# Patient Record
Sex: Female | Born: 1975 | Race: Black or African American | Hispanic: No | Marital: Single | Smoking: Never smoker
Health system: Southern US, Community
[De-identification: ages and names within clinical notes are randomized; demographics above are authoritative.]

## PROBLEM LIST (undated history)

## (undated) DIAGNOSIS — E669 Obesity, unspecified: Secondary | ICD-10-CM

## (undated) DIAGNOSIS — M1990 Unspecified osteoarthritis, unspecified site: Secondary | ICD-10-CM

## (undated) DIAGNOSIS — J302 Other seasonal allergic rhinitis: Secondary | ICD-10-CM

## (undated) HISTORY — DX: Unspecified osteoarthritis, unspecified site: M19.90

---

## 1998-03-30 ENCOUNTER — Encounter: Admission: RE | Admit: 1998-03-30 | Discharge: 1998-03-30 | Payer: Self-pay | Admitting: Obstetrics & Gynecology

## 1998-04-14 ENCOUNTER — Ambulatory Visit (HOSPITAL_COMMUNITY): Admission: RE | Admit: 1998-04-14 | Discharge: 1998-04-14 | Payer: Self-pay | Admitting: Obstetrics & Gynecology

## 1998-04-19 ENCOUNTER — Encounter: Admission: RE | Admit: 1998-04-19 | Discharge: 1998-07-18 | Payer: Self-pay | Admitting: Obstetrics & Gynecology

## 1998-06-07 ENCOUNTER — Emergency Department (HOSPITAL_COMMUNITY): Admission: EM | Admit: 1998-06-07 | Discharge: 1998-06-07 | Payer: Self-pay | Admitting: Emergency Medicine

## 2001-03-31 ENCOUNTER — Emergency Department (HOSPITAL_COMMUNITY): Admission: EM | Admit: 2001-03-31 | Discharge: 2001-03-31 | Payer: Self-pay | Admitting: Emergency Medicine

## 2007-08-15 ENCOUNTER — Emergency Department (HOSPITAL_COMMUNITY): Admission: EM | Admit: 2007-08-15 | Discharge: 2007-08-15 | Payer: Self-pay | Admitting: Emergency Medicine

## 2011-10-21 ENCOUNTER — Emergency Department (INDEPENDENT_AMBULATORY_CARE_PROVIDER_SITE_OTHER)
Admission: EM | Admit: 2011-10-21 | Discharge: 2011-10-21 | Disposition: A | Discharge status: Home / Self Care | Payer: Worker's Compensation | Source: Home / Self Care | Attending: Family Medicine | Admitting: Family Medicine

## 2011-10-21 ENCOUNTER — Encounter (HOSPITAL_COMMUNITY): Payer: Self-pay

## 2011-10-21 DIAGNOSIS — S61012A Laceration without foreign body of left thumb without damage to nail, initial encounter: Secondary | ICD-10-CM

## 2011-10-21 DIAGNOSIS — Z23 Encounter for immunization: Secondary | ICD-10-CM

## 2011-10-21 DIAGNOSIS — S61209A Unspecified open wound of unspecified finger without damage to nail, initial encounter: Secondary | ICD-10-CM

## 2011-10-21 MED ORDER — TETANUS-DIPHTHERIA TOXOIDS TD 5-2 LFU IM INJ
INJECTION | INTRAMUSCULAR | Status: AC
  Filled 2011-10-21: qty 0.5

## 2011-10-21 MED ORDER — TETANUS-DIPHTHERIA TOXOIDS TD 5-2 LFU IM INJ
0.5000 mL | INJECTION | Freq: Once | INTRAMUSCULAR | Status: AC
  Administered 2011-10-21: 0.5 mL via INTRAMUSCULAR

## 2011-10-21 NOTE — ED Provider Notes (Signed)
History     CSN: [id number]  Arrival date & time 10/21/11  1606   First MD Initiated Contact with Patient 10/21/11 1607      Chief Complaint  Patient presents with  . Laceration    (Consider location/radiation/quality/duration/timing/severity/associated sxs/prior treatment) Patient is a 36 y.o. female presenting with skin laceration. The history is provided by the patient.  Laceration  The incident occurred 3 to 5 hours ago. The laceration is located on the left hand. The laceration is 1 cm in size. The laceration mechanism was a a clean knife. The patient is experiencing no pain. She reports no foreign bodies present. Her tetanus status is out of date.    History reviewed. No pertinent past medical history.  History reviewed. No pertinent past surgical history.  History reviewed. No pertinent family history.  History  Substance Use Topics  . Smoking status: Never Smoker   . Smokeless tobacco: Not on file  . Alcohol Use: No    OB History    Grav Para Term Preterm Abortions TAB SAB Ect Mult Living                  Review of Systems  Constitutional: Negative.   Skin: Positive for wound.    Allergies  Review of patient's allergies indicates no known allergies.  Home Medications  No current outpatient prescriptions on file.  BP 138/84  Pulse 86  Temp 97.9 F (36.6 C)  Resp 18  SpO2 100%  Physical Exam  Nursing note and vitals reviewed. Constitutional: She is oriented to person, place, and time. She appears well-developed and well-nourished.  HENT:  Head: Normocephalic.  Neurological: She is alert and oriented to person, place, and time.  Skin:       Lac as noted.    ED Course  LACERATION REPAIR Date/Time: 10/21/2011 5:44 PM Performed by: Linna Hoff Authorized by: Bradd Canary D Consent: Verbal consent obtained. Consent given by: patient Body area: upper extremity Location details: left thumb Laceration length: 1 cm Tendon involvement:  none Nerve involvement: none Vascular damage: no Patient sedated: no Preparation: Patient was prepped and draped in the usual sterile fashion. Debridement: none Degree of undermining: none Skin closure: glue Approximation: close Approximation difficulty: simple Patient tolerance: Patient tolerated the procedure well with no immediate complications.   (including critical care time)  Labs Reviewed - No data to display No results found.   1. Laceration of thumb, left       MDM          Linna Hoff, MD 10/21/11 1750

## 2011-10-21 NOTE — ED Notes (Signed)
Pt was cutting ham at work and cut lt thumb.

## 2012-03-22 ENCOUNTER — Encounter (HOSPITAL_COMMUNITY): Payer: Self-pay | Admitting: Emergency Medicine

## 2012-03-22 ENCOUNTER — Emergency Department (HOSPITAL_COMMUNITY)
Admission: EM | Admit: 2012-03-22 | Discharge: 2012-03-22 | Disposition: A | Discharge status: Home / Self Care | Payer: Self-pay | Attending: Emergency Medicine | Admitting: Emergency Medicine

## 2012-03-22 DIAGNOSIS — M545 Low back pain, unspecified: Secondary | ICD-10-CM

## 2012-03-22 HISTORY — DX: Obesity, unspecified: E66.9

## 2012-03-22 MED ORDER — IBUPROFEN 800 MG PO TABS: 800.0000 mg | ORAL_TABLET | Freq: Three times a day (TID) | ORAL | Status: AC

## 2012-03-22 MED ORDER — DIAZEPAM 5 MG PO TABS: 5.0000 mg | ORAL_TABLET | Freq: Two times a day (BID) | ORAL | Status: AC

## 2012-03-22 NOTE — ED Notes (Signed)
PT. REPORTS LOW BACK PAIN FOR 2 DAYS , DENIES INJURY , AMBULATORY , NO DYSURIA OR HEMATURIA.

## 2012-03-25 NOTE — ED Provider Notes (Signed)
History     CSN: [id number]  Arrival date & time 03/22/12  0009   First MD Initiated Contact with Patient 03/22/12 0045      Chief Complaint  Patient presents with  . Back Pain    (Consider location/radiation/quality/duration/timing/severity/associated sxs/prior treatment) Patient is a 36 y.o. female presenting with back pain. The history is provided by the patient.  Back Pain  This is a new problem. The current episode started 2 days ago. The problem occurs constantly. The problem has not changed since onset.The pain is associated with no known injury. The pain is present in the lumbar spine. The quality of the pain is described as aching. The pain does not radiate. The pain is mild. The symptoms are aggravated by bending, twisting and certain positions. The pain is the same all the time. Pertinent negatives include no fever, no numbness, no headaches, no abdominal pain, no bowel incontinence, no perianal numbness, no bladder incontinence, no dysuria, no pelvic pain, no leg pain, no paresthesias, no paresis, no tingling and no weakness. She has tried nothing for the symptoms.    Past Medical History  Diagnosis Date  . Obesity     History reviewed. No pertinent past surgical history.  No family history on file.  History  Substance Use Topics  . Smoking status: Never Smoker   . Smokeless tobacco: Not on file  . Alcohol Use: No    OB History    Grav Para Term Preterm Abortions TAB SAB Ect Mult Living                  Review of Systems  Constitutional: Negative for fever and chills.  Gastrointestinal: Negative for nausea, vomiting, abdominal pain and bowel incontinence.  Genitourinary: Negative for bladder incontinence, dysuria and pelvic pain.  Musculoskeletal: Positive for back pain.  Skin: Negative for wound.  Neurological: Negative for tingling, weakness, numbness, headaches and paresthesias.    Allergies  Review of patient's allergies indicates no known  allergies.  Home Medications   Current Outpatient Rx  Name Route Sig Dispense Refill  . DIAZEPAM 5 MG PO TABS Oral Take 1 tablet (5 mg total) by mouth 2 (two) times daily. 10 tablet 0  . IBUPROFEN 800 MG PO TABS Oral Take 1 tablet (800 mg total) by mouth 3 (three) times daily. 21 tablet 0    BP 137/86  Pulse 80  Temp 97.7 F (36.5 C) (Oral)  Resp 18  SpO2 100%  Physical Exam  Nursing note and vitals reviewed. Constitutional: She is oriented to person, place, and time. She appears well-developed and well-nourished. No distress.  HENT:  Head: Normocephalic and atraumatic.  Neck: Normal range of motion.  Cardiovascular: Normal rate, regular rhythm and normal heart sounds.   Pulmonary/Chest: Effort normal and breath sounds normal. She exhibits no tenderness.  Abdominal: Soft. Bowel sounds are normal. There is no tenderness.  Musculoskeletal:       Lumbar back: She exhibits decreased range of motion and tenderness. She exhibits no bony tenderness, no swelling and no edema.       Spine: no palp stepoff, crepitus, or deformity. Palpable paravertebral spasm to R low back. Neg straight leg raise.  Neurological: She is alert and oriented to person, place, and time. She has normal strength. No sensory deficit. Gait normal.  Reflex Scores:      Patellar reflexes are 2+ on the right side and 2+ on the left side.      Achilles reflexes are 2+ on  the right side and 2+ on the left side.      Normal, steady gait Sensation grossly intact to lt touch  Skin: Skin is warm and dry. She is not diaphoretic.    ED Course  Procedures (including critical care time)  Labs Reviewed - No data to display No results found.   1. Low back pain       MDM  Pt presents with low back pain. Palp spasm on exam. No "red flags" for back pain by hx or exam. Rxes for ibuprofen, valium. Reasons to return discussed.        Grant Fontana, PA-C 03/25/12 2240

## 2012-04-30 NOTE — ED Provider Notes (Signed)
Medical screening examination/treatment/procedure(s) were performed by non-physician practitioner and as supervising physician I was immediately available for consultation/collaboration.  Tiffanee Mcnee K Gavinn Collard, MD 04/30/12 0416 

## 2015-11-20 ENCOUNTER — Ambulatory Visit (HOSPITAL_COMMUNITY)
Admission: EM | Admit: 2015-11-20 | Discharge: 2015-11-20 | Disposition: A | Discharge status: Home / Self Care | Payer: Self-pay | Attending: Emergency Medicine | Admitting: Emergency Medicine

## 2015-11-20 ENCOUNTER — Encounter (HOSPITAL_COMMUNITY): Payer: Self-pay | Admitting: *Deleted

## 2015-11-20 DIAGNOSIS — L03012 Cellulitis of left finger: Secondary | ICD-10-CM

## 2015-11-20 HISTORY — DX: Other seasonal allergic rhinitis: J30.2

## 2015-11-20 MED ORDER — CEPHALEXIN 500 MG PO CAPS: 500.0000 mg | ORAL_CAPSULE | Freq: Four times a day (QID) | ORAL | Status: DC

## 2015-11-20 NOTE — Discharge Instructions (Signed)
You have an infection around the nail. This is called paronychia. Take Keflex 4 times a day for 1 week. Soak the finger in warm soapy water 3 times a day for 20 minutes. Dry the finger well after soaking it. Please get some rubber gloves to wear when washing dishes. Prolonged submersion in water will make this worse. You should see improvement over the next several days. The nail will take some time to grow back normally. Follow-up as needed.

## 2015-11-20 NOTE — ED Notes (Signed)
Pt describes paronychia to left 4th finger x 1 wk; initially had been applying warm compresses and popped with needle - had purulent drainage with some improvement, but now swelling increasing again.

## 2015-11-20 NOTE — ED Provider Notes (Signed)
CSN: [id number]     Arrival date & time 11/20/15  1429 History   First MD Initiated Contact with Patient 11/20/15 1625     Chief Complaint  Patient presents with  . Hand Pain   (Consider location/radiation/quality/duration/timing/severity/associated sxs/prior Treatment) HPI  She is a 40 year old woman here for evaluation of left ring finger pain. She states 2 weeks ago she noticed pain and swelling around the cuticle. A week ago she popped it with a needle and expressed some pus. Since then, the pain has been better, but she continues to have some swelling. No fevers. She denies biting her nails. She does work washing dishes.  Past Medical History  Diagnosis Date  . Obesity   . Seasonal allergies    History reviewed. No pertinent past surgical history. No family history on file. Social History  Substance Use Topics  . Smoking status: Never Smoker   . Smokeless tobacco: None  . Alcohol Use: No   OB History    No data available     Review of Systems As in history of present illness Allergies  Review of patient's allergies indicates no known allergies.  Home Medications   Prior to Admission medications   Medication Sig Start Date End Date Taking? Authorizing Provider  CETIRIZINE HCL PO Take by mouth.   Yes Historical Provider, MD  UNKNOWN TO PATIENT BCPs   Yes Historical Provider, MD  cephALEXin (KEFLEX) 500 MG capsule Take 1 capsule (500 mg total) by mouth 4 (four) times daily. 11/20/15   Charm RingsErin J Honig, MD   Meds Ordered and Administered this Visit  Medications - No data to display  BP 123/80 mmHg  Pulse 66  Temp(Src) 97.8 F (36.6 C) (Oral)  SpO2 100%  LMP 10/19/2015 (Approximate) No data found.   Physical Exam  Constitutional: She is oriented to person, place, and time. She appears well-developed and well-nourished. No distress.  Cardiovascular: Normal rate.   Pulmonary/Chest: Effort normal.  Musculoskeletal:  Left hand: Mild swelling at the fourth finger  cuticle. No tenderness or redness. No fluctuance. The ring finger nail is rough at the proximal edge.  Neurological: She is alert and oriented to person, place, and time.    ED Course  Procedures (including critical care time)  Labs Review Labs Reviewed - No data to display  Imaging Review No results found.   MDM   1. Paronychia of finger of left hand    We'll treat with Keflex. Nothing amenable to drainage today. Recommended warm soaks for the next several days. Discussed importance of keeping her hands as dry as possible during the day to prevent this from becoming a chronic issue. Follow-up as needed.    Charm RingsErin J Honig, MD 11/20/15 [phone]

## 2020-11-24 ENCOUNTER — Other Ambulatory Visit: Payer: Self-pay | Admitting: Obstetrics and Gynecology

## 2020-11-24 DIAGNOSIS — Z1231 Encounter for screening mammogram for malignant neoplasm of breast: Secondary | ICD-10-CM

## 2020-12-16 ENCOUNTER — Ambulatory Visit: Payer: Self-pay

## 2020-12-23 ENCOUNTER — Ambulatory Visit: Payer: No Typology Code available for payment source | Admitting: *Deleted

## 2020-12-23 ENCOUNTER — Other Ambulatory Visit: Payer: Self-pay

## 2020-12-23 ENCOUNTER — Ambulatory Visit
Admission: RE | Admit: 2020-12-23 | Discharge: 2020-12-23 | Disposition: A | Discharge status: Home / Self Care | Payer: Self-pay | Source: Ambulatory Visit | Attending: Obstetrics and Gynecology | Admitting: Obstetrics and Gynecology

## 2020-12-23 VITALS — BP 128/74 | Wt 339.4 lb

## 2020-12-23 DIAGNOSIS — Z1231 Encounter for screening mammogram for malignant neoplasm of breast: Secondary | ICD-10-CM

## 2020-12-23 DIAGNOSIS — Z1239 Encounter for other screening for malignant neoplasm of breast: Secondary | ICD-10-CM

## 2020-12-23 NOTE — Patient Instructions (Signed)
Explained breast self awareness with Kaitlyn Mueller. Patient did not need a Pap smear today due to last Pap smear was in February or March 2022 per patient. Let her know [facility] will cover Pap smears every 3 years unless has a history of abnormal Pap smears. Referred patient to the [facility] for a screening mammogram on the mobile unit. Appointment scheduled Thursday, Dec 23, 2020 at 0940. Patient escorted to the mobile unit following [facility] appointment for her screening mammogram. Let patient know the Breast Center will follow up with her within the next couple weeks with results of her mammogram by letter or phone. Cassandre Oleksy verbalized understanding.  Aadvika Konen, Kathaleen Maser, RN 9:08 AM

## 2020-12-23 NOTE — Progress Notes (Signed)
Ms. Kaitlyn Mueller is a 45 y.o. female who presents to [facility] clinic today with no complaints.    Pap Smear: Pap smear not completed today. Last Pap smear was in February or March 2021 at the [facility] clinic and was normal per patient. Per patient has no history of an abnormal Pap smear. Last Pap smear result is not available in Epic.   Physical exam: Breasts Right breast larger than left breast that per patient is normal for her. No skin abnormalities bilateral breasts. No nipple retraction bilateral breasts. No nipple discharge bilateral breasts. No lymphadenopathy. No lumps palpated bilateral breasts. No complaints of pain or tenderness on exam.      Pelvic/Bimanual Pap is not indicated today per [facility] guidelines.   Smoking History: Patient has never smoked.   Patient Navigation: Patient education provided. Access to services provided for patient through [facility] program.    Breast and Cervical Cancer Risk Assessment: Patient does not have family history of breast cancer, known genetic mutations, or radiation treatment to the chest before age 62. Patient does not have history of cervical dysplasia, immunocompromised, or DES exposure in-utero.  Risk Assessment    Risk Scores      12/23/2020   Last edited by: Kaitlyn Mueller, CMA   5-year risk: 0.6 %   Lifetime risk: 7.3 %         A: [facility] exam without pap smear No complaints.  P: Referred patient to the [facility] for a screening mammogram on the mobile unit. Appointment scheduled Thursday, Dec 23, 2020 at 0940.  Kaitlyn Heidelberg, RN 12/23/2020 9:08 AM

## 2021-08-06 ENCOUNTER — Other Ambulatory Visit: Payer: Self-pay

## 2021-08-06 ENCOUNTER — Ambulatory Visit (HOSPITAL_COMMUNITY)
Admission: EM | Admit: 2021-08-06 | Discharge: 2021-08-06 | Disposition: A | Discharge status: Home / Self Care | Payer: No Typology Code available for payment source | Attending: Physician Assistant | Admitting: Physician Assistant

## 2021-08-06 ENCOUNTER — Encounter (HOSPITAL_COMMUNITY): Payer: Self-pay | Admitting: *Deleted

## 2021-08-06 DIAGNOSIS — M5441 Lumbago with sciatica, right side: Secondary | ICD-10-CM

## 2021-08-06 DIAGNOSIS — M25551 Pain in right hip: Secondary | ICD-10-CM

## 2021-08-06 MED ORDER — TIZANIDINE HCL 4 MG PO TABS: 4.0000 mg | ORAL_TABLET | Freq: Three times a day (TID) | ORAL | 0 refills | Status: DC | PRN

## 2021-08-06 MED ORDER — DICLOFENAC SODIUM 75 MG PO TBEC: 75.0000 mg | DELAYED_RELEASE_TABLET | Freq: Two times a day (BID) | ORAL | 0 refills | Status: DC

## 2021-08-06 NOTE — ED Provider Notes (Signed)
[facility]    CSN: [id number] Arrival date & time: 08/06/21  1138      History   Chief Complaint Chief Complaint  Patient presents with   Hip Pain   Back Pain    HPI Kaitlyn Mueller is a 45 y.o. female.   Patient presents today with a several day history of right-sided lower back pain as well as several week history of right lateral hip pain.  She denies any known injury or increase in activity prior to symptom onset.  Does report that back pain began after a hard sneeze and has been worsening since that time.  Pain is rated 8 on a 0-10 pain scale, localized to lower back with radiation into right hip, described as sharp, worse with certain movements or changing position, no alleviating factors identified.  She has tried Bengay/IcyHot as well as Motrin without improvement of symptoms.  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  She has had occasional paresthesias in her right leg but denies any bowel/bladder incontinence, bowel/bladder retention, saddle anesthesia, lower extremity weakness.  Denies previous injury or surgery involving back or hip.  She denies any recent falls or car accidents.  Denies history of malignancy.  She is confident she is not pregnant.   Past Medical History:  Diagnosis Date   Obesity    Seasonal allergies     There are no problems to display for this patient.   History reviewed. No pertinent surgical history.  OB History   No obstetric history on file.      Home Medications    Prior to Admission medications   Medication Sig Start Date End Date Taking? Authorizing Provider  diclofenac (VOLTAREN) 75 MG EC tablet Take 1 tablet (75 mg total) by mouth 2 (two) times daily. 08/06/21  Yes Vanessa Alesi K, PA-C  tiZANidine (ZANAFLEX) 4 MG tablet Take 1 tablet (4 mg total) by mouth every 8 (eight) hours as needed for muscle spasms.  08/06/21  Yes Anamaria Dusenbury K, PA-C  cephALEXin (KEFLEX) 500 MG capsule Take 1 capsule (500 mg total) by mouth 4 (four) times daily. Patient not taking: Reported on 12/23/2020 11/20/15   Charm Rings, MD  CETIRIZINE HCL PO Take by mouth. Patient not taking: Reported on 12/23/2020    [provider]  UNKNOWN TO PATIENT BCPs Patient not taking: Reported on 12/23/2020    [provider]    Family History Family History  Problem Relation Age of Onset   Hypertension Mother    Breast cancer Neg Hx     Social History Social History   Tobacco Use   Smoking status: Never   Smokeless tobacco: Never  Vaping Use   Vaping Use: Never used  Substance Use Topics   Alcohol use: No   Drug use: No     Allergies   Patient has no known allergies.   Review of Systems Review of Systems  Constitutional:  Positive for activity change. Negative for appetite change, fatigue and fever.  Respiratory:  Negative for cough and shortness of breath.   Cardiovascular:  Negative for chest pain.  Gastrointestinal:  Negative for abdominal pain, diarrhea, nausea and vomiting.  Genitourinary:  Negative for dysuria, frequency, pelvic pain, urgency, vaginal bleeding, vaginal discharge and vaginal pain.  Musculoskeletal:  Positive for arthralgias, back pain and gait problem. Negative for joint swelling and  myalgias.  Neurological:  Positive for numbness. Negative for dizziness, weakness, light-headedness and headaches.    Physical Exam Triage Vital Signs ED Triage Vitals  Enc Vitals Group     BP 08/06/21 1257 (!) 144/85     Pulse Rate 08/06/21 1257 72     Resp 08/06/21 1257 18     Temp 08/06/21 1257 97.9 F (36.6 C)     Temp src --      SpO2 08/06/21 1257 100 %     Weight --      Height --      Head Circumference --      Peak Flow --      Pain Score 08/06/21 1254 8     Pain Loc --      Pain Edu? --      Excl. in GC? --    No data found.  Updated Vital Signs BP (!) 144/85     Pulse 72    Temp 97.9 F (36.6 C)    Resp 18    LMP 05/21/2021    SpO2 100%   Visual Acuity Right Eye Distance:   Left Eye Distance:   Bilateral Distance:    Right Eye Near:   Left Eye Near:    Bilateral Near:     Physical Exam Vitals reviewed.  Constitutional:      General: She is awake. She is not in acute distress.    Appearance: Normal appearance. She is well-developed. She is not ill-appearing.     Comments: Very pleasant female appears stated age in no acute distress sitting comfortably in exam room  HENT:     Head: Normocephalic and atraumatic.  Cardiovascular:     Rate and Rhythm: Normal rate and regular rhythm.     Heart sounds: Normal heart sounds, S1 normal and S2 normal. No murmur heard. Pulmonary:     Effort: Pulmonary effort is normal.     Breath sounds: Normal breath sounds. No wheezing, rhonchi or rales.     Comments: Clear to auscultation bilaterally Abdominal:     Palpations: Abdomen is soft.     Tenderness: There is no abdominal tenderness. There is no right CVA tenderness, left CVA tenderness, guarding or rebound.  Musculoskeletal:     Cervical back: No tenderness or bony tenderness.     Thoracic back: No tenderness or bony tenderness.     Lumbar back: Tenderness present. No bony tenderness.     Right hip: Tenderness present. No deformity or bony tenderness. Normal range of motion. Normal strength.     Comments: Back: No pain percussion of vertebrae.  Tenderness palpation of right lumbar paraspinal muscles.  No deformity or spasm noted.  Strength 5/5 bilateral lower extremities.  Psychiatric:        Behavior: Behavior is cooperative.     UC Treatments / Results  Labs (all labs ordered are listed, but only abnormal results are displayed) Labs Reviewed - No data to display  EKG   Radiology No results found.  Procedures Procedures (including critical care time)  Medications Ordered in UC Medications - No data to display  Initial Impression /  Assessment and Plan / UC Course  I have reviewed the triage vital signs and the nursing notes.  Pertinent labs & imaging results that were available during my care of the patient were reviewed by me and considered in my medical decision making (see chart for details).     No indication for plain films given patient denies  any recent trauma and has no bony tenderness.  Patient was prescribed diclofenac 75 mg twice daily for 10 days with instruction not to take additional NSAIDs with this medication due to risk of GI bleeding.  She can use Tylenol for breakthrough pain.  Encouraged topical medication as well as heat and gentle stretch for symptom relief.  She was prescribed Zanaflex to be used up to 3 times a day with instruction not to drive or drink alcohol while taking this medication due to associated sedation.  Discussed that if symptoms or not improving she should follow-up with sports medicine provider for potential imaging and additional interventions.  Discussed alarm symptoms that warrant emergent evaluation including lower extremity weakness, saddle anesthesia, bowel/bladder incontinence or retention, increased pain.  Strict return precautions given to which she expressed understanding.  Work excuse note provided as requested.  Final Clinical Impressions(s) / UC Diagnoses   Final diagnoses:  Acute right-sided low back pain with right-sided sciatica  Right hip pain     Discharge Instructions      Start diclofenac twice daily.  This is like ibuprofen and you should not take additional NSAIDs including aspirin, ibuprofen/Advil, naproxen/Aleve with this medication as can cause stomach bleeding.  Take your tizanidine (Zanaflex) up to 3 times a day.  This will make you sleepy so do not drive or drink alcohol while taking it.  Use heat, rest, stretch for additional symptom relief.  If symptoms not improving please follow-up with sports medicine as we discussed.  If you have any worsening  symptoms including severe pain, difficulty moving your lower leg, going to the bathroom on yourself or trouble going to the bathroom, numbness of the inner part of your leg you need to go to the emergency room.     ED Prescriptions     Medication Sig Dispense Auth. Provider   tiZANidine (ZANAFLEX) 4 MG tablet Take 1 tablet (4 mg total) by mouth every 8 (eight) hours as needed for muscle spasms. 30 tablet Jandi Swiger K, PA-C   diclofenac (VOLTAREN) 75 MG EC tablet Take 1 tablet (75 mg total) by mouth 2 (two) times daily. 20 tablet Paolina Karwowski, Noberto Retort, PA-C      PDMP not reviewed this encounter.   Jeani Hawking, PA-C 08/06/21 1326

## 2021-08-06 NOTE — ED Triage Notes (Signed)
Pt reports her back started to hurt after she sneezed  the other day. Pt also reports Bil hip pain for over a week.

## 2021-08-06 NOTE — Discharge Instructions (Signed)
Start diclofenac twice daily.  This is like ibuprofen and you should not take additional NSAIDs including aspirin, ibuprofen/Advil, naproxen/Aleve with this medication as can cause stomach bleeding.  Take your tizanidine (Zanaflex) up to 3 times a day.  This will make you sleepy so do not drive or drink alcohol while taking it.  Use heat, rest, stretch for additional symptom relief.  If symptoms not improving please follow-up with sports medicine as we discussed.  If you have any worsening symptoms including severe pain, difficulty moving your lower leg, going to the bathroom on yourself or trouble going to the bathroom, numbness of the inner part of your leg you need to go to the emergency room.

## 2022-01-23 ENCOUNTER — Other Ambulatory Visit: Payer: Self-pay | Admitting: Obstetrics and Gynecology

## 2022-01-23 DIAGNOSIS — Z1231 Encounter for screening mammogram for malignant neoplasm of breast: Secondary | ICD-10-CM

## 2022-02-03 ENCOUNTER — Ambulatory Visit
Admission: RE | Admit: 2022-02-03 | Discharge: 2022-02-03 | Disposition: A | Discharge status: Home / Self Care | Payer: No Typology Code available for payment source | Source: Ambulatory Visit | Attending: Obstetrics and Gynecology | Admitting: Obstetrics and Gynecology

## 2022-02-03 DIAGNOSIS — Z1231 Encounter for screening mammogram for malignant neoplasm of breast: Secondary | ICD-10-CM

## 2022-10-19 IMAGING — MG MM DIGITAL SCREENING BILAT W/ TOMO AND CAD
6 of 12 series · 6 of 36 positions shown · non-contrast
Comparison: None.

CLINICAL DATA: Screening.

EXAM:
DIGITAL SCREENING BILATERAL MAMMOGRAM WITH TOMOSYNTHESIS AND CAD
TECHNIQUE: Bilateral screening digital craniocaudal and mediolateral oblique
mammograms were obtained. Bilateral screening digital breast
tomosynthesis was performed. The images were evaluated with
computer-aided detection.

[L CC synth-2D (1 of 2)]
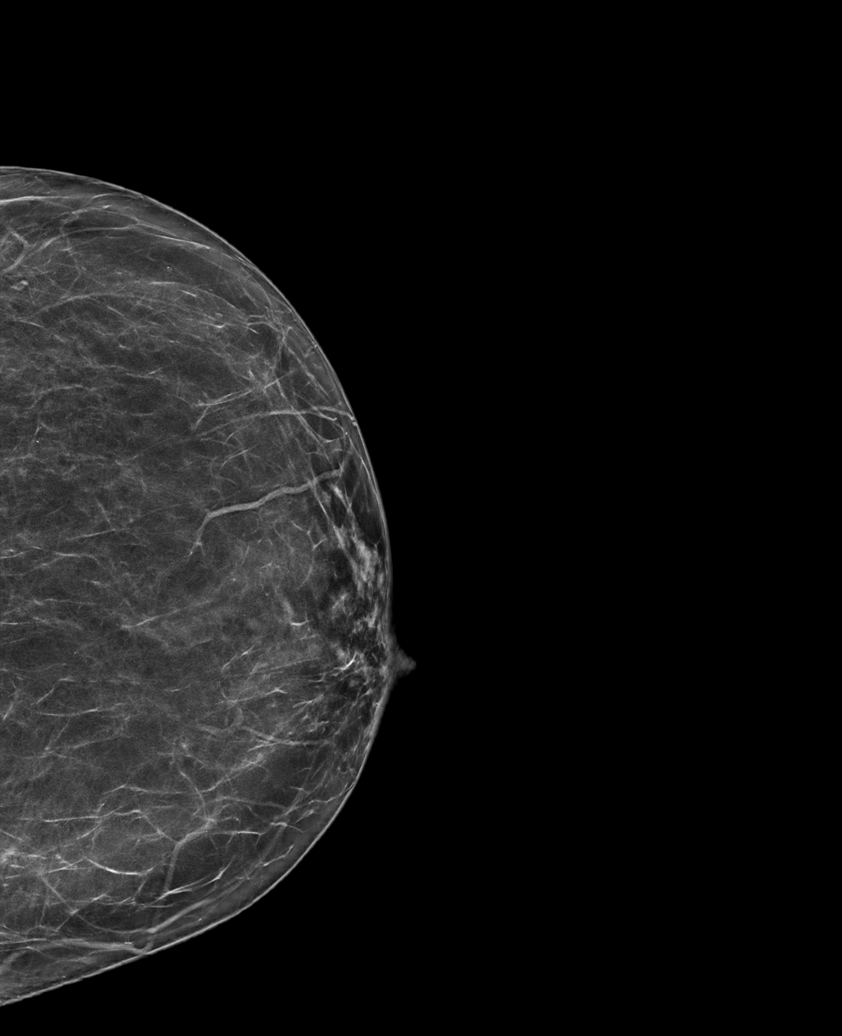

[R MLO synth-2D]
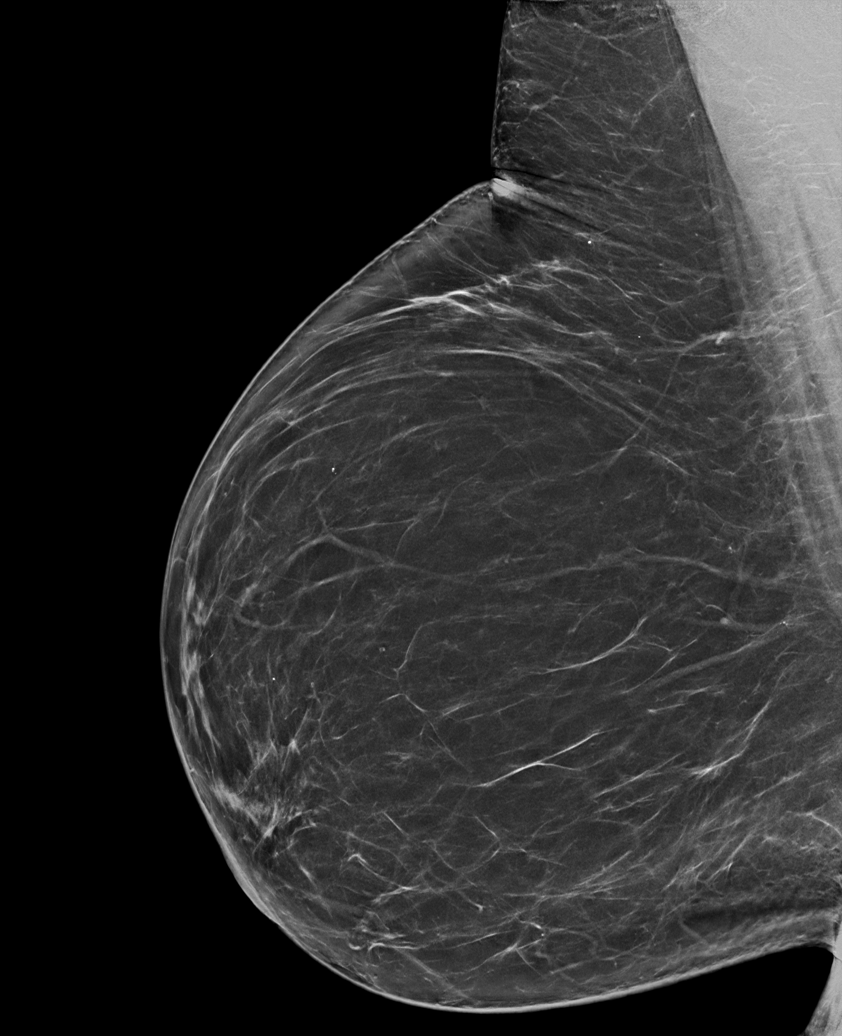

[L CC synth-2D (2 of 2)]
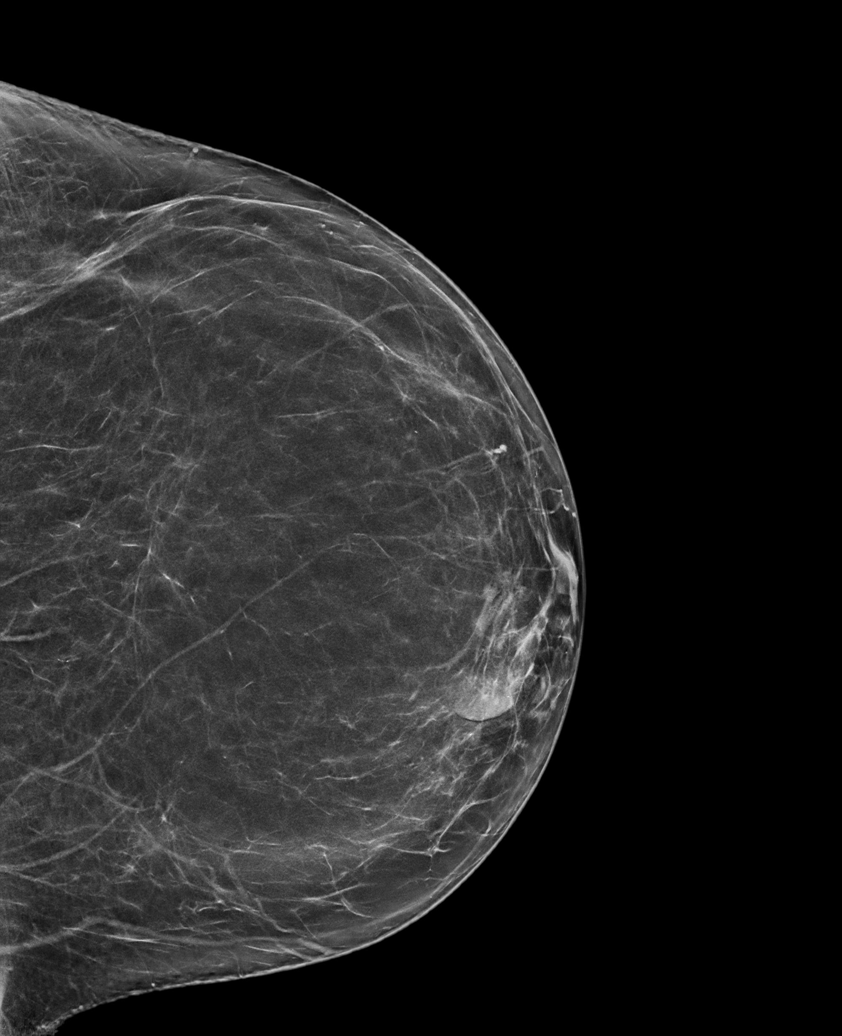

[L MLO synth-2D]
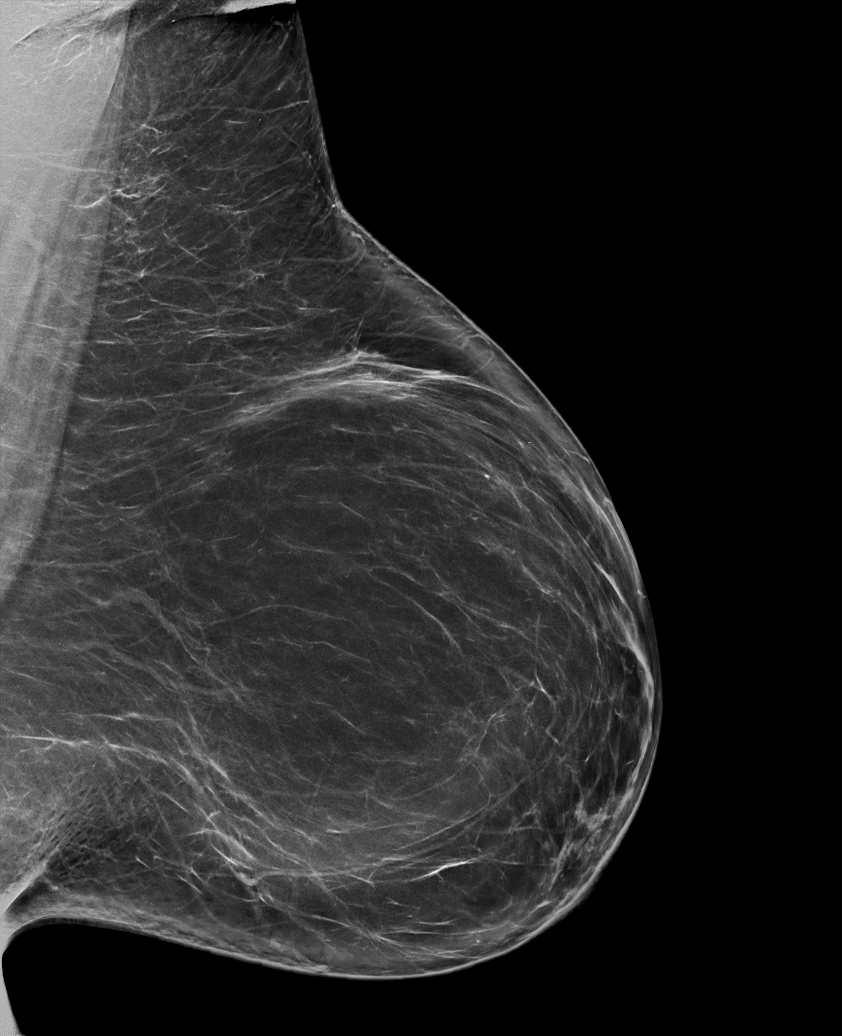

[R CC synth-2D (1 of 2)]
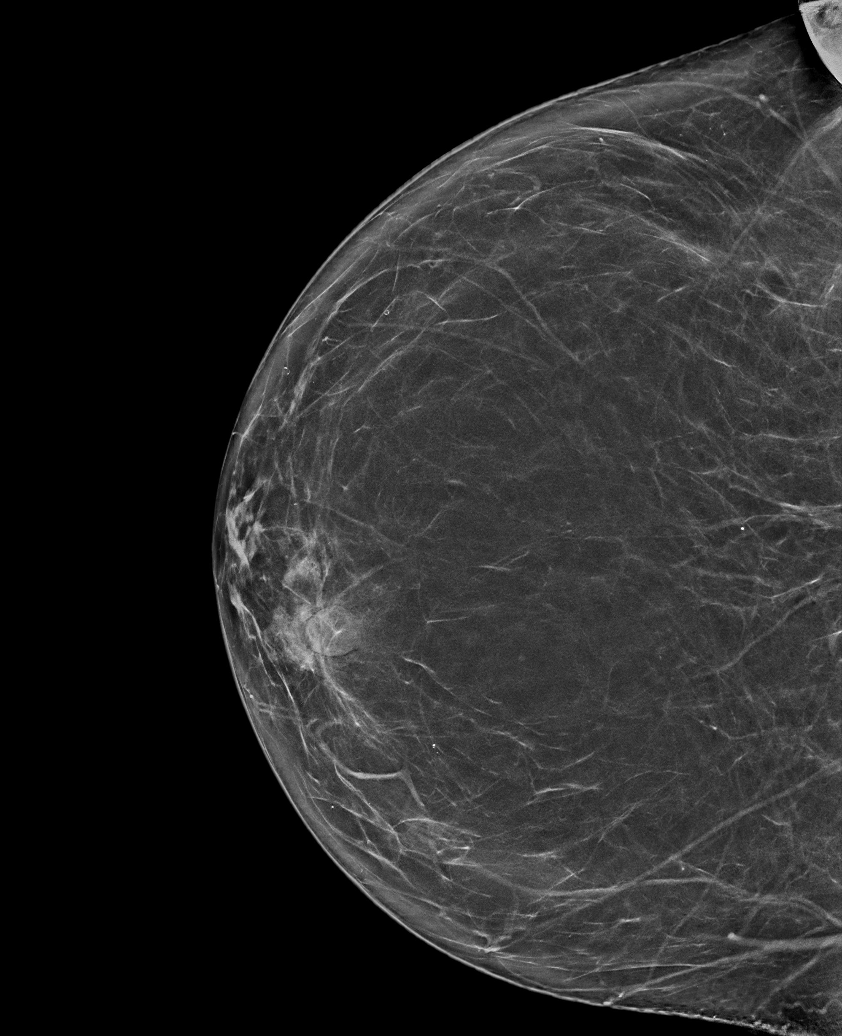

[R CC synth-2D (2 of 2)]
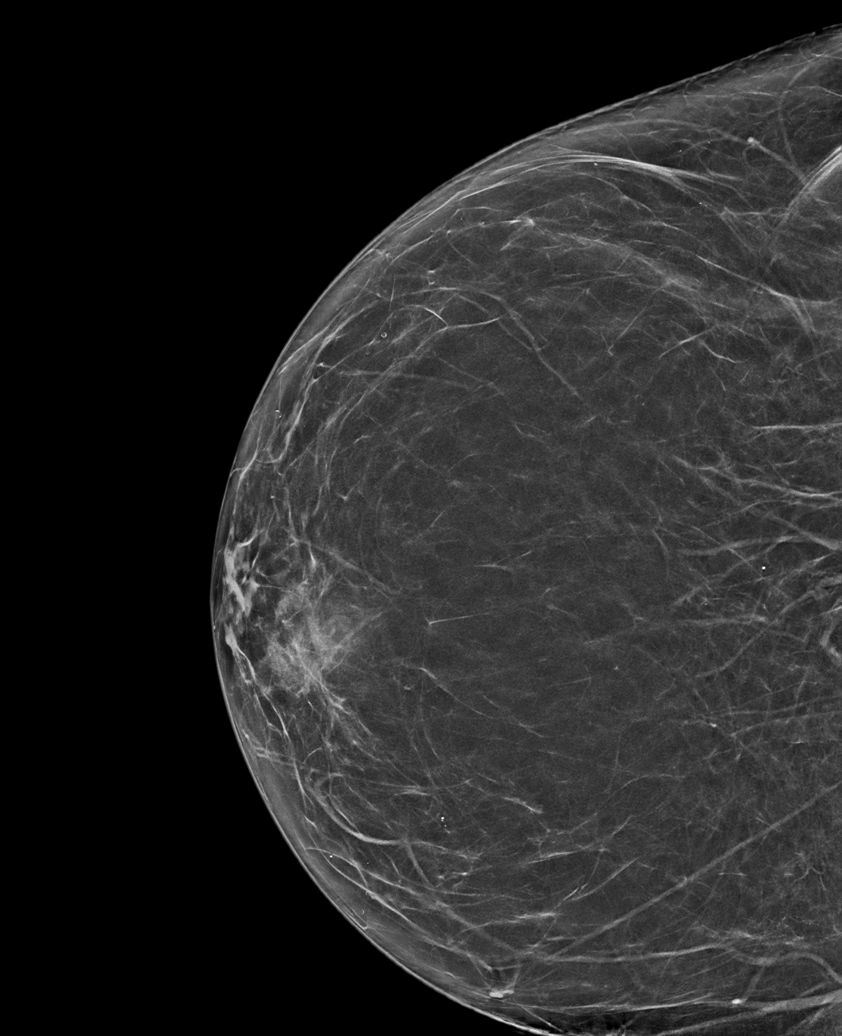

[6 of 36 positions shown; findings below may reference images not displayed]

ACR Breast Density Category b: There are scattered areas of
fibroglandular density.
FINDINGS: There are no findings suspicious for malignancy. The images were
evaluated with computer-aided detection.
IMPRESSION: No mammographic evidence of malignancy. A result letter of this
screening mammogram will be mailed directly to the patient.

RECOMMENDATION:
Screening mammogram in one year. (Code:C7-6-ASJ)

BI-RADS CATEGORY  1: Negative.

## 2023-03-28 DIAGNOSIS — Z1231 Encounter for screening mammogram for malignant neoplasm of breast: Secondary | ICD-10-CM

## 2023-05-21 ENCOUNTER — Encounter: Payer: Self-pay | Admitting: Podiatry

## 2023-05-21 ENCOUNTER — Ambulatory Visit (INDEPENDENT_AMBULATORY_CARE_PROVIDER_SITE_OTHER): Payer: 59

## 2023-05-21 ENCOUNTER — Ambulatory Visit: Payer: 59 | Admitting: Podiatry

## 2023-05-21 VITALS — BP 139/51 | HR 74

## 2023-05-21 DIAGNOSIS — R600 Localized edema: Secondary | ICD-10-CM | POA: Diagnosis not present

## 2023-05-21 DIAGNOSIS — I872 Venous insufficiency (chronic) (peripheral): Secondary | ICD-10-CM | POA: Diagnosis not present

## 2023-05-21 NOTE — Progress Notes (Signed)
Chief Complaint  Patient presents with   Foot Pain    "My foot has a burning sensation and it swells." N - burning and swelling L - bilateral foot D - 2 mos. O - suddenly C - burning and swelling A - standing all day T - drink lemon water   HPI: 47 y.o. female presents today with concern of swelling in her right leg and ankle.  States is been going on for 2 months.  Denies any injury or change in activity that brought this on.  Denies any new medications around this time.  States that the swelling gets worse as the day progresses and then improves if she elevates her foot or after she has slept overnight.  She has not tried any compression stockings.  States that she gets some aching to the inner right ankle area as the swelling gets worse.  Past Medical History:  Diagnosis Date   Obesity    Seasonal allergies    No past surgical history on file.  No Known Allergies   Physical Exam: Vitals:   05/21/23 1108  BP: (!) 139/51  Pulse: 74   General: The patient is alert and oriented x3 in no acute distress.  Dermatology: Skin is warm, dry and supple bilateral lower extremities. Interspaces are clear of maceration and debris.    Vascular: Palpable pedal pulses bilaterally. Capillary refill within normal limits.  +2 pitting edema right ankle and lower leg.  Minimal edema left ankle.  No significant calor noted.  Neurological: Light touch sensation grossly intact bilateral feet.   Musculoskeletal Exam: No crepitus with range of motion of the ankle or subtalar joint bilateral.  Ankle dorsiflexion is within normal limits bilateral.  Manual muscle testing 5/5 bilateral.  Radiographic Exam right foot and ankle, 3 weightbearing views, 05/21/2023):  Normal osseous mineralization.  There is inferior calcaneal spurring noted.  There are some degenerative changes noted at the midtarsal joint.  No fracture seen.  Assessment/Plan of Care: 1. Localized edema   2. Venous insufficiency  (chronic) (peripheral)     AMB REFERRAL TO VASCULAR SURGERY/CLINIC  Discussed clinical findings with patient today.  As since there is such a contrast between the right lower leg and ankle edema compared to the left, and this only began approximately 2 months ago without incident, feel that she would be best served with a vascular consult to evaluate the second venous insufficiency and pitting edema to the right lower extremity.  Her x-rays show some mild degenerative changes throughout the foot but nothing that would cause this type of swelling.  Informed the patient that they will most likely want to perform some type of imaging study, such as a venous ultrasound to map out the area and see if there are any valvular insufficiencies that can be treated directly or is long-term edema therapy may be necessary which would include compression stockings, leg elevation and possible diuretics.  In the meantime she was encouraged to purchase over-the-counter knee-high socks of the lower compression, such as 10 to 15 mmHg, to see how well this helps control some of the edema.  Follow-up prn    Clerance Lav, DPM, FACFAS [facility]     [address], [place] [address]  Office [phone]  Fax [phone]

## 2023-05-23 ENCOUNTER — Other Ambulatory Visit: Payer: Self-pay | Admitting: *Deleted

## 2023-05-23 ENCOUNTER — Other Ambulatory Visit: Payer: Self-pay | Admitting: Obstetrics and Gynecology

## 2023-05-23 DIAGNOSIS — M7989 Other specified soft tissue disorders: Secondary | ICD-10-CM

## 2023-05-23 DIAGNOSIS — Z1231 Encounter for screening mammogram for malignant neoplasm of breast: Secondary | ICD-10-CM

## 2023-05-28 ENCOUNTER — Ambulatory Visit (HOSPITAL_COMMUNITY)
Admission: RE | Admit: 2023-05-28 | Discharge: 2023-05-28 | Disposition: A | Discharge status: Home / Self Care | Payer: 59 | Source: Ambulatory Visit | Attending: Surgery | Admitting: Surgery

## 2023-05-28 DIAGNOSIS — M7989 Other specified soft tissue disorders: Secondary | ICD-10-CM

## 2023-05-30 DIAGNOSIS — Z1231 Encounter for screening mammogram for malignant neoplasm of breast: Secondary | ICD-10-CM

## 2023-06-01 ENCOUNTER — Ambulatory Visit
Admission: RE | Admit: 2023-06-01 | Discharge: 2023-06-01 | Disposition: A | Discharge status: Home / Self Care | Payer: 59 | Source: Ambulatory Visit

## 2023-06-01 DIAGNOSIS — Z1231 Encounter for screening mammogram for malignant neoplasm of breast: Secondary | ICD-10-CM

## 2023-06-26 ENCOUNTER — Ambulatory Visit: Payer: 59 | Admitting: Physician Assistant

## 2023-06-26 VITALS — BP 134/85 | HR 87 | Temp 98.0°F | Resp 18 | Ht 64.0 in | Wt 377.7 lb

## 2023-06-26 DIAGNOSIS — M7989 Other specified soft tissue disorders: Secondary | ICD-10-CM

## 2023-06-26 NOTE — Progress Notes (Signed)
VASCULAR & VEIN SPECIALISTS           [place]  History and Physical   Kaitlyn Mueller is a 47 y.o. female who presents with right leg swelling.   She was seen by Podiatry on 05/21/2023 with complaints of leg swelling.  It improved with leg elevation and got progressively worse throughout the day when she was on her feet.   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  She states that her right leg swells throughout the day and improves with taking a nap or being in bed overnight.  She does not have any issues with the left leg.  She states that her leg is achy and tired at the end of the day.  She has mild skin changes in the lower legs.  She does have family hx of varicose veins with her grandmother.  She has not worn compression.  She does not have any hx of DVT.  She has never had any procedures on her legs.  She does have a small cluster of veins on the anterior leg and these have never bled.   The pt is not on a statin for cholesterol management.  The pt is not on a daily aspirin.   Other AC:  none The pt is not on medication for hypertension.   The pt is not on medication for diabetes.   Tobacco hx:  never  Pt does not have family hx of AAA.  Past Medical History:  Diagnosis Date   Obesity    Seasonal allergies     No past surgical history on file.  Social History   Socioeconomic History   Marital status: Single    Spouse name: Not on file   Number of children: Not on file   Years of education: Not on file   Highest education level: Not on file  Occupational History   Not on file  Tobacco Use   Smoking status: Never   Smokeless tobacco: Never  Vaping Use   Vaping status: Never Used  Substance and Sexual Activity   Alcohol use: No   Drug use: No   Sexual activity: Yes    Birth control/protection: Pill  Other Topics Concern   Not on file  Social History Narrative   Not on file   Social  Determinants of Health   Financial Resource Strain: Not on file  Food Insecurity: Not on file  Transportation Needs: No Transportation Needs (12/23/2020)   PRAPARE - Administrator, Civil Service (Medical): No    Lack of Transportation (Non-Medical): No  Physical Activity: Not on file  Stress: Not on file  Social Connections: Not on file  Intimate Partner Violence: Not on file    Family History  Problem Relation Age of Onset   Hypertension Mother    Breast cancer Neg Hx    BRCA 1/2 Neg Hx     Current Outpatient Medications  Medication Sig Dispense Refill   cephALEXin (KEFLEX) 500 MG capsule Take 1 capsule (500 mg total) by mouth 4 (four) times daily. (Patient not taking: Reported on 12/23/2020) 28 capsule 0   CETIRIZINE HCL PO Take by mouth. (Patient not taking: Reported on 12/23/2020)     diclofenac (VOLTAREN) 75 MG EC tablet Take 1 tablet (75 mg total) by mouth 2 (two) times daily. 20 tablet 0   tiZANidine (ZANAFLEX)  4 MG tablet Take 1 tablet (4 mg total) by mouth every 8 (eight) hours as needed for muscle spasms. 30 tablet 0   UNKNOWN TO PATIENT BCPs (Patient not taking: Reported on 12/23/2020)     No current facility-administered medications for this visit.    No Known Allergies  REVIEW OF SYSTEMS:   [X]  denotes positive finding, [ ]  denotes negative finding Cardiac  Comments:  Chest pain or chest pressure:    Shortness of breath upon exertion:    Short of breath when lying flat:    Irregular heart rhythm:        Vascular    Pain in calf, thigh, or hip brought on by ambulation:    Pain in feet at night that wakes you up from your sleep:     Blood clot in your veins:    Leg swelling:  x See HPI      Pulmonary    Oxygen at home:    Productive cough:     Wheezing:         Neurologic    Sudden weakness in arms or legs:     Sudden numbness in arms or legs:     Sudden onset of difficulty speaking or slurred speech:    Temporary loss of vision in one eye:      Problems with dizziness:         Gastrointestinal    Blood in stool:     Vomited blood:         Genitourinary    Burning when urinating:     Blood in urine:        Psychiatric    Major depression:         Hematologic    Bleeding problems:    Problems with blood clotting too easily:        Skin    Rashes or ulcers:        Constitutional    Fever or chills:      PHYSICAL EXAMINATION:  Today's Vitals   06/26/23 1328 06/26/23 1332  BP: 134/85   Pulse: 87   Resp: 18   Temp: 98 F (36.7 C)   TempSrc: Temporal   SpO2: 98%   Weight: (!) 377 lb 11.2 oz (171.3 kg)   Height: 5\' 4"  (1.626 m)   PainSc: 8  8   PainLoc: Foot    Body mass index is 64.83 kg/m.   General:  WDWN in NAD; vital signs documented above Gait: Not observed HENT: WNL, normocephalic Pulmonary: normal non-labored breathing without wheezing Cardiac: regular HR; without carotid bruits Abdomen: soft, NT, aortic pulse is not palpable Skin: without rashes Vascular Exam/Pulses:  Right Left  Radial 2+ (normal) 2+ (normal)  DP 2+ (normal) 2+ (normal)   Extremities: + RLE swelling.  Small cluster of superficial veins anterior below right knee.  Mild skin color changes distal legs bilaterally; -stemmer sign  Neurologic: A&O X 3;  moving all extremities equally Psychiatric:  The pt has Normal affect.   Non-Invasive Vascular Imaging:   Venous duplex on 05/28/2023: Venous Reflux Times  +--------------+---------+------+-----------+------------+--------+  RIGHT        Reflux NoRefluxReflux TimeDiameter cmsComments                          Yes                                   +--------------+---------+------+-----------+------------+--------+  CFV                    yes   >1 second                       +--------------+---------+------+-----------+------------+--------+  FV mid        no                                               +--------------+---------+------+-----------+------------+--------+  Popliteal    no                                              +--------------+---------+------+-----------+------------+--------+  GSV at SFJ              yes    >500 ms      1.21              +--------------+---------+------+-----------+------------+--------+  GSV prox thighno                            0.69              +--------------+---------+------+-----------+------------+--------+  GSV mid thigh no                            0.80              +--------------+---------+------+-----------+------------+--------+  GSV dist thighno                            0.37              +--------------+---------+------+-----------+------------+--------+  GSV at knee                                         NWV       +--------------+---------+------+-----------+------------+--------+  GSV prox calf                                       NWV       +--------------+---------+------+-----------+------------+--------+  SSV Pop Fossa no                            0.28              +--------------+---------+------+-----------+------------+--------+  SSV prox calf no                            0.39              +--------------+---------+------+-----------+------------+--------+   Summary:  Right:  - No evidence of deep vein thrombosis seen in the right lower extremity, from the common femoral through the popliteal veins.  - No evidence of superficial venous thrombosis in the right lower extremity.    - The deep venous system is incompetent at the common femoral vein.  - The great saphenous vein is incompetent at  the saphenofemoral junction.  - The small saphenous vein is competent     Kaitlyn Mueller is a 47 y.o. female who presents with: RLE swelling    -pt has easily palpable DP pedal pulses bilaterally -pt does not have evidence of DVT.  Pt does have venous reflux in  the deep right venous system.  She also has reflux at the GSV at the [facility] and no other reflux is noted.  -discussed with pt about wearing knee high 15-20 mmHg compression stockings and pt was measured for these today.   Discussed putting them on in the morning before getting out of bed and taking them off at night.   -discussed the importance of leg elevation and how to elevate properly - pt is advised to elevate their legs and a diagram is given to them to demonstrate for pt to lay flat on their back with knees elevated and slightly bent with their feet higher than their knees, which puts their feet higher than their heart for 15 minutes per day.  If pt cannot lay flat, advised to lay as flat as possible.  -discussed that if she ever has any bleeding from the superficial cluster of veins, she should elevate her leg and hold single finger pressure for about 20-30 minutes.  She expressed understanding.  -pt is advised to continue as much walking as possible and avoid sitting or standing for long periods of time.  -discussed importance of weight loss and exercise and that water aerobics would also be beneficial.  -handout with recommendations given -pt will f/u as needed.    Doreatha Massed, [facility] [phone]  Clinic MD:  Myra Gianotti on call MD

## 2023-09-13 ENCOUNTER — Ambulatory Visit (HOSPITAL_COMMUNITY)
Admission: EM | Admit: 2023-09-13 | Discharge: 2023-09-13 | Disposition: A | Discharge status: Home / Self Care | Payer: Self-pay | Attending: Physician Assistant | Admitting: Physician Assistant

## 2023-09-13 ENCOUNTER — Encounter (HOSPITAL_COMMUNITY): Payer: Self-pay

## 2023-09-13 DIAGNOSIS — J069 Acute upper respiratory infection, unspecified: Secondary | ICD-10-CM | POA: Insufficient documentation

## 2023-09-13 DIAGNOSIS — R062 Wheezing: Secondary | ICD-10-CM | POA: Insufficient documentation

## 2023-09-13 DIAGNOSIS — R051 Acute cough: Secondary | ICD-10-CM | POA: Insufficient documentation

## 2023-09-13 MED ORDER — PROMETHAZINE-DM 6.25-15 MG/5ML PO SYRP: 5.0000 mL | ORAL_SOLUTION | Freq: Four times a day (QID) | ORAL | 0 refills | Status: DC | PRN

## 2023-09-13 MED ORDER — AEROCHAMBER PLUS FLO-VU LARGE MISC
1.0000 | Freq: Once | Status: AC
  Administered 2023-09-13: 1

## 2023-09-13 MED ORDER — AEROCHAMBER PLUS FLO-VU LARGE MISC: Status: AC

## 2023-09-13 MED ORDER — ALBUTEROL SULFATE HFA 108 (90 BASE) MCG/ACT IN AERS
2.0000 | INHALATION_SPRAY | Freq: Once | RESPIRATORY_TRACT | Status: AC
  Administered 2023-09-13: 2 via RESPIRATORY_TRACT

## 2023-09-13 MED ORDER — ALBUTEROL SULFATE HFA 108 (90 BASE) MCG/ACT IN AERS: INHALATION_SPRAY | RESPIRATORY_TRACT | Status: AC

## 2023-09-13 MED ORDER — PREDNISONE 20 MG PO TABS: 40.0000 mg | ORAL_TABLET | Freq: Every day | ORAL | 0 refills | Status: AC

## 2023-09-13 NOTE — ED Triage Notes (Signed)
 Pt presents with chest tightness, c/o trouble breathing and states it hurts when she coughs. Pt states it has been going on since Monday.   Home interventions: Tylenol

## 2023-09-13 NOTE — Discharge Instructions (Signed)
 I am concerned that you have a virus that is causing inflammation in your airways (bronchitis).  I will contact you if you are positive for COVID.  Start prednisone  40 mg for 5 days.  Do not take NSAIDs with this medication including aspirin, ibuprofen /Advil , naproxen/Aleve.  You can use Mucinex, Flonase, Tylenol for additional symptom relief.  Take Promethazine  DM for cough.  This will make you sleepy so do not drive drink alcohol while taking it.  If your symptoms are not improving within a few days or if anything worsens and you have high fever, worsening cough, shortness of breath, chest pain, nausea, vomiting you need to be seen immediately.

## 2023-09-13 NOTE — ED Provider Notes (Signed)
 [facility]    CSN: [id number] Arrival date & time: 09/13/23  1120      History   Chief Complaint Chief Complaint  Patient presents with   Chest Pain   Cough    HPI Kaitlyn Mueller is a 48 y.o. female.   Patient presents today with a 4-day history of URI symptoms.  She reports chest tightness, shortness of breath, cough, mild congestion.  Denies any fever, chest pain, nausea, vomiting, diarrhea.  She has tried honey and lemon tea as well as cold and flu medication without improvement of symptoms.  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  She has had COVID several years ago.  She has not had COVID-19 vaccinations.  Denies history of allergies, asthma, COPD, smoking.  She is confident that she is not pregnant.  She is having difficulty sleeping at night as a result of symptoms.    Past Medical History:  Diagnosis Date   Obesity    Seasonal allergies     There are no active problems to display for this patient.   History reviewed. No pertinent surgical history.  OB History   No obstetric history on file.      Home Medications    Prior to Admission medications   Medication Sig Start Date End Date Taking? Authorizing Provider  predniSONE  (DELTASONE ) 20 MG tablet Take 2 tablets (40 mg total) by mouth daily for 5 days. 09/13/23 09/18/23 Yes Millianna Szymborski K, PA-C  promethazine -dextromethorphan (PROMETHAZINE -DM) 6.25-15 MG/5ML syrup Take 5 mLs by mouth 4 (four) times daily as needed for cough. 09/13/23  Yes Dilon Lank K, PA-C  CETIRIZINE HCL PO Take by mouth. Patient not taking: Reported on 06/26/2023    [provider]  UNKNOWN TO PATIENT BCPs Patient not taking: Reported on 06/26/2023    [provider]    Family History Family History  Problem Relation Age of Onset   Hypertension Mother    Breast cancer Neg Hx    BRCA 1/2 Neg Hx     Social History Social History    Tobacco Use   Smoking status: Never   Smokeless tobacco: Never  Vaping Use   Vaping status: Never Used  Substance Use Topics   Alcohol use: No   Drug use: No     Allergies   Patient has no known allergies.   Review of Systems Review of Systems  Constitutional:  Positive for activity change. Negative for appetite change, fatigue and fever.  HENT:  Negative for congestion, sinus pressure, sneezing and sore throat.   Respiratory:  Positive for cough, chest tightness and shortness of breath. Negative for wheezing.   Cardiovascular:  Negative for chest pain.  Gastrointestinal:  Negative for abdominal pain, diarrhea, nausea and vomiting.  Musculoskeletal:  Negative for arthralgias and myalgias.  Neurological:  Negative for dizziness, light-headedness and headaches.     Physical Exam Triage Vital Signs ED Triage Vitals  Encounter Vitals Group     BP 09/13/23 1355 120/75     Systolic BP Percentile --      Diastolic BP Percentile --      Pulse Rate 09/13/23 1211 95     Resp 09/13/23 1210 19     Temp 09/13/23 1355 98.5 F (36.9 C)     Temp src --      SpO2 09/13/23 1211 97 %     Weight --  Height --      Head Circumference --      Peak Flow --      Pain Score 09/13/23 1209 8     Pain Loc --      Pain Education --      Exclude from Growth Chart --    No data found.  Updated Vital Signs BP 120/75   Pulse 95   Temp 98.5 F (36.9 C)   Resp 19   LMP  (LMP Unknown)   SpO2 97%   Visual Acuity Right Eye Distance:   Left Eye Distance:   Bilateral Distance:    Right Eye Near:   Left Eye Near:    Bilateral Near:     Physical Exam Vitals reviewed.  Constitutional:      General: She is awake. She is not in acute distress.    Appearance: Normal appearance. She is well-developed. She is not ill-appearing.     Comments: Very pleasant female appears stated age in no acute distress sitting comfortably in exam room  HENT:     Head: Normocephalic and atraumatic.      Right Ear: Tympanic membrane, ear canal and external ear normal. Tympanic membrane is not erythematous or bulging.     Left Ear: Tympanic membrane, ear canal and external ear normal. Tympanic membrane is not erythematous or bulging.     Nose:     Right Sinus: No maxillary sinus tenderness or frontal sinus tenderness.     Left Sinus: No maxillary sinus tenderness or frontal sinus tenderness.     Mouth/Throat:     Pharynx: Uvula midline. Postnasal drip present. No oropharyngeal exudate or posterior oropharyngeal erythema.  Cardiovascular:     Rate and Rhythm: Normal rate and regular rhythm.     Heart sounds: Normal heart sounds, S1 normal and S2 normal. No murmur heard. Pulmonary:     Effort: Pulmonary effort is normal.     Breath sounds: Wheezing present. No rhonchi or rales.     Comments: Reactive cough with deep breathing.  Scattered wheezing. Psychiatric:        Behavior: Behavior is cooperative.      UC Treatments / Results  Labs (all labs ordered are listed, but only abnormal results are displayed) Labs Reviewed  SARS CORONAVIRUS 2 (TAT 6-24 HRS)    EKG   Radiology No results found.  Procedures Procedures (including critical care time)  Medications Ordered in UC Medications  albuterol  (VENTOLIN  HFA) 108 (90 Base) MCG/ACT inhaler 2 puff (2 puffs Inhalation Given 09/13/23 1428)  AeroChamber Plus Flo-Vu Large MISC 1 each (1 each Other Given 09/13/23 1428)    Initial Impression / Assessment and Plan / UC Course  I have reviewed the triage vital signs and the nursing notes.  Pertinent labs & imaging results that were available during my care of the patient were reviewed by me and considered in my medical decision making (see chart for details).     Patient is well-appearing, afebrile, nontoxic, nontachycardic.  No evidence of acute infection on physical exam that would warrant initiation of antibiotics.  Flu testing was deferred as she is already been symptomatic for  4 days and this would not change management.  COVID testing is pending.  If she is positive for COVID-19 she would be a candidate for antiviral therapy based on her age and BMI but she will be outside the window of effectiveness when we receive her results so we will defer initiation of Paxlovid.  She  was given albuterol  in clinic with some improvement of symptoms.  She was sent home with this medication to be used every 4-6 hours as needed.  She is to start prednisone  burst to help manage her symptoms and we discussed that she is not to take NSAIDs with this medication.  She can use Mucinex, Flonase, Tylenol for additional symptom relief.  She was given Promethazine  DM for cough.  We discussed that this can be sedating and she is not to drive or drink alcohol with taking it.  Recommended she rest and drink plenty of fluid.  If your symptoms or not improving within a week or if she has any worsening symptoms she needs to be seen immediately.  Strict return precautions given.  Work excuse note provided.  Final Clinical Impressions(s) / UC Diagnoses   Final diagnoses:  Upper respiratory tract infection, unspecified type  Acute cough  Wheezing     Discharge Instructions      I am concerned that you have a virus that is causing inflammation in your airways (bronchitis).  I will contact you if you are positive for COVID.  Start prednisone  40 mg for 5 days.  Do not take NSAIDs with this medication including aspirin, ibuprofen /Advil , naproxen/Aleve.  You can use Mucinex, Flonase, Tylenol for additional symptom relief.  Take Promethazine  DM for cough.  This will make you sleepy so do not drive drink alcohol while taking it.  If your symptoms are not improving within a few days or if anything worsens and you have high fever, worsening cough, shortness of breath, chest pain, nausea, vomiting you need to be seen immediately.     ED Prescriptions     Medication Sig Dispense Auth. Provider   predniSONE   (DELTASONE ) 20 MG tablet Take 2 tablets (40 mg total) by mouth daily for 5 days. 10 tablet Denisha Hoel K, PA-C   promethazine -dextromethorphan (PROMETHAZINE -DM) 6.25-15 MG/5ML syrup Take 5 mLs by mouth 4 (four) times daily as needed for cough. 118 mL Gaetano Romberger K, PA-C      PDMP not reviewed this encounter.   Sherrell Rocky POUR, PA-C 09/13/23 1501

## 2023-09-14 LAB — SARS CORONAVIRUS 2 (TAT 6-24 HRS): SARS Coronavirus 2: NEGATIVE

## 2023-12-12 DIAGNOSIS — Z1231 Encounter for screening mammogram for malignant neoplasm of breast: Secondary | ICD-10-CM

## 2024-01-07 ENCOUNTER — Encounter: Payer: Self-pay | Admitting: Obstetrics and Gynecology

## 2024-02-26 ENCOUNTER — Ambulatory Visit (HOSPITAL_BASED_OUTPATIENT_CLINIC_OR_DEPARTMENT_OTHER): Payer: Self-pay | Admitting: Family Medicine

## 2024-03-11 ENCOUNTER — Encounter (HOSPITAL_BASED_OUTPATIENT_CLINIC_OR_DEPARTMENT_OTHER): Payer: Self-pay | Admitting: Certified Nurse Midwife

## 2024-03-18 ENCOUNTER — Ambulatory Visit (HOSPITAL_BASED_OUTPATIENT_CLINIC_OR_DEPARTMENT_OTHER): Admitting: Family Medicine

## 2024-03-18 ENCOUNTER — Encounter (HOSPITAL_BASED_OUTPATIENT_CLINIC_OR_DEPARTMENT_OTHER): Payer: Self-pay | Admitting: Family Medicine

## 2024-03-18 ENCOUNTER — Ambulatory Visit (INDEPENDENT_AMBULATORY_CARE_PROVIDER_SITE_OTHER): Admitting: Family Medicine

## 2024-03-18 DIAGNOSIS — Z0000 Encounter for general adult medical examination without abnormal findings: Secondary | ICD-10-CM

## 2024-03-18 NOTE — Assessment & Plan Note (Signed)
 Long discussion today reviewing weight loss medications including injectable options including GLP-1 receptor agonist, oral agents including Contrave, orlistat, phentermine.  We discussed potential risk, benefits, cost associated with these various medications as well as the possibility of insurance coverage or lack thereof.  We discussed typical dosing regimen for both injectable and oral medications, proper administration of each.  We discussed potential outcomes with each. We will plan for labs with upcoming physical and can review options at that time with plan to proceed with medication Additional consideration is for referral to healthy weight and wellness clinic

## 2024-03-18 NOTE — Progress Notes (Signed)
 New Patient Office Visit  Subjective   Patient ID: Kaitlyn Mueller, female    DOB: 24-Jan-1976  Age: 48 y.o. MRN: [id number]  CC:  Chief Complaint  Patient presents with   Establish Care   Weight Management Screening    HPI Cotina Freedman presents to establish care Last PCP - through [facility], Dr. Rogelio  Weight: Has tried SlimFast, tried some pills like lipozene, no prescription medications. Has tried increasing physical activity, tried more walking.  Has been focusing on lifestyle modifications and OTC measures for a number of years.  Wants to set up full physical.  Reports family history of high blood pressure.  Patient is originally from [facility]. 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 She enjoys going out, going to the park, shopping, being with family.  Outpatient Encounter Medications as of 03/18/2024  Medication Sig   [DISCONTINUED] CETIRIZINE HCL PO Take by mouth. (Patient not taking: Reported on 06/26/2023)   [DISCONTINUED] promethazine -dextromethorphan (PROMETHAZINE -DM) 6.25-15 MG/5ML syrup Take 5 mLs by mouth 4 (four) times daily as needed for cough.   [DISCONTINUED] UNKNOWN TO PATIENT BCPs (Patient not taking: Reported on 06/26/2023)   No facility-administered encounter medications on file as of 03/18/2024.    Past Medical History:  Diagnosis Date   Obesity    Seasonal allergies     History reviewed. No pertinent surgical history.  Family History  Problem Relation Age of Onset   Hypertension Mother    Breast cancer Neg Hx    BRCA 1/2 Neg Hx     Social History   Socioeconomic History   Marital status: Single    Spouse name: Not on file   Number of children: Not on file   Years of education: Not on file   Highest education level: Not on file  Occupational History   Not on file  Tobacco Use   Smoking status: Never    Passive exposure: Never   Smokeless tobacco: Never  Vaping Use   Vaping status: Never Used  Substance and Sexual  Activity   Alcohol use: No   Drug use: No   Sexual activity: Yes    Birth control/protection: None  Other Topics Concern   Not on file  Social History Narrative   Not on file   Social Drivers of Health   Financial Resource Strain: Not on file  Food Insecurity: Not on file  Transportation Needs: No Transportation Needs (12/23/2020)   PRAPARE - Administrator, Civil Service (Medical): No    Lack of Transportation (Non-Medical): No  Physical Activity: Not on file  Stress: Not on file  Social Connections: Not on file  Intimate Partner Violence: Not on file    Objective   BP 136/82   Pulse 73   Temp 97.8 F (36.6 C) (Oral)   Resp (!) 8   Ht 5' 4 (1.626 m)   Wt (!) 370 lb (167.8 kg)   LMP  (LMP Unknown)   SpO2 96%   BMI 63.51 kg/m   Physical Exam  48 year old female in no acute distress Cardiovascular exam with regular rate and rhythm Lungs clear to auscultation bilaterally  Assessment & Plan:   Severe obesity (BMI >= 40) (HCC) Assessment & Plan: Long discussion today reviewing weight loss medications including injectable options including GLP-1 receptor agonist, oral agents including Contrave, orlistat, phentermine.  We discussed potential risk, benefits, cost associated with these various medications as well as the possibility of insurance coverage or lack thereof.  We discussed  typical dosing regimen for both injectable and oral medications, proper administration of each.  We discussed potential outcomes with each. We will plan for labs with upcoming physical and can review options at that time with plan to proceed with medication Additional consideration is for referral to healthy weight and wellness clinic   Wellness examination -     CBC with Differential/Platelet; Future -     Comprehensive metabolic panel with GFR; Future -     Hemoglobin A1c; Future -     Lipid panel; Future -     TSH Rfx on Abnormal to Free T4; Future  Return in about 4 weeks  (around 04/15/2024) for CPE with fasting labs 1 week prior.    ___________________________________________ Trine Fread de [place], MD, ABFM, CAQSM Primary Care and Sports Medicine [facility]

## 2024-03-24 ENCOUNTER — Encounter: Payer: Self-pay | Admitting: Adult Health

## 2024-04-16 ENCOUNTER — Other Ambulatory Visit (HOSPITAL_BASED_OUTPATIENT_CLINIC_OR_DEPARTMENT_OTHER): Payer: Self-pay | Admitting: *Deleted

## 2024-04-16 DIAGNOSIS — Z0000 Encounter for general adult medical examination without abnormal findings: Secondary | ICD-10-CM

## 2024-04-17 ENCOUNTER — Ambulatory Visit (HOSPITAL_BASED_OUTPATIENT_CLINIC_OR_DEPARTMENT_OTHER): Payer: Self-pay | Admitting: Family Medicine

## 2024-04-17 LAB — COMPREHENSIVE METABOLIC PANEL WITH GFR
ALT: 15 IU/L (ref 0–32)
AST: 15 IU/L (ref 0–40)
Albumin: 3.9 g/dL (ref 3.9–4.9)
Alkaline Phosphatase: 111 IU/L (ref 44–121)
BUN/Creatinine Ratio: 15 (ref 9–23)
BUN: 11 mg/dL (ref 6–24)
Bilirubin Total: 0.7 mg/dL (ref 0.0–1.2)
CO2: 28 mmol/L (ref 20–29)
Calcium: 9.2 mg/dL (ref 8.7–10.2)
Chloride: 101 mmol/L (ref 96–106)
Creatinine, Ser: 0.74 mg/dL (ref 0.57–1.00)
Globulin, Total: 3.9 g/dL (ref 1.5–4.5)
Glucose: 76 mg/dL (ref 70–99)
Potassium: 4.3 mmol/L (ref 3.5–5.2)
Sodium: 139 mmol/L (ref 134–144)
Total Protein: 7.8 g/dL (ref 6.0–8.5)
eGFR: 100 mL/min/1.73 (ref >59)

## 2024-04-17 LAB — CBC WITH DIFFERENTIAL/PLATELET
Basophils Absolute: 0.1 x10E3/uL (ref 0.0–0.2)
Basos: 1 %
EOS (ABSOLUTE): 0.2 x10E3/uL (ref 0.0–0.4)
Eos: 3 %
Hematocrit: 38.9 % (ref 34.0–46.6)
Hemoglobin: 11.1 g/dL (ref 11.1–15.9)
Immature Grans (Abs): 0 x10E3/uL (ref 0.0–0.1)
Immature Granulocytes: 0 %
Lymphocytes Absolute: 1.9 x10E3/uL (ref 0.7–3.1)
Lymphs: 30 %
MCH: 23.2 pg — ABNORMAL LOW (ref 26.6–33.0)
MCHC: 28.5 g/dL — ABNORMAL LOW (ref 31.5–35.7)
MCV: 81 fL (ref 79–97)
Monocytes Absolute: 0.6 x10E3/uL (ref 0.1–0.9)
Monocytes: 9 %
Neutrophils Absolute: 3.6 x10E3/uL (ref 1.4–7.0)
Neutrophils: 57 %
Platelets: 237 x10E3/uL (ref 150–450)
RBC: 4.79 x10E6/uL (ref 3.77–5.28)
RDW: 15.6 % — ABNORMAL HIGH (ref 11.7–15.4)
WBC: 6.4 x10E3/uL (ref 3.4–10.8)

## 2024-04-17 LAB — LIPID PANEL
Chol/HDL Ratio: 3.2 ratio (ref 0.0–4.4)
Cholesterol, Total: 184 mg/dL (ref 100–199)
HDL: 58 mg/dL (ref >39)
LDL Chol Calc (NIH): 112 mg/dL — ABNORMAL HIGH (ref 0–99)
Triglycerides: 73 mg/dL (ref 0–149)
VLDL Cholesterol Cal: 14 mg/dL (ref 5–40)

## 2024-04-17 LAB — HEMOGLOBIN A1C
Est. average glucose Bld gHb Est-mCnc: 120 mg/dL
Hgb A1c MFr Bld: 5.8 % — ABNORMAL HIGH (ref 4.8–5.6)

## 2024-04-17 LAB — TSH RFX ON ABNORMAL TO FREE T4: TSH: 0.585 u[IU]/mL (ref 0.450–4.500)

## 2024-04-23 ENCOUNTER — Encounter (HOSPITAL_BASED_OUTPATIENT_CLINIC_OR_DEPARTMENT_OTHER): Admitting: Family Medicine

## 2024-05-01 ENCOUNTER — Ambulatory Visit (HOSPITAL_BASED_OUTPATIENT_CLINIC_OR_DEPARTMENT_OTHER): Admitting: Family Medicine

## 2024-05-01 ENCOUNTER — Encounter (HOSPITAL_BASED_OUTPATIENT_CLINIC_OR_DEPARTMENT_OTHER): Payer: Self-pay | Admitting: Family Medicine

## 2024-05-01 VITALS — BP 111/75 | HR 71 | Ht 64.0 in | Wt 370.9 lb

## 2024-05-01 DIAGNOSIS — Z0000 Encounter for general adult medical examination without abnormal findings: Secondary | ICD-10-CM | POA: Diagnosis not present

## 2024-05-01 DIAGNOSIS — Z23 Encounter for immunization: Secondary | ICD-10-CM | POA: Diagnosis not present

## 2024-05-01 DIAGNOSIS — Z124 Encounter for screening for malignant neoplasm of cervix: Secondary | ICD-10-CM

## 2024-05-01 DIAGNOSIS — Z1211 Encounter for screening for malignant neoplasm of colon: Secondary | ICD-10-CM | POA: Diagnosis not present

## 2024-05-01 MED ORDER — ZEPBOUND 2.5 MG/0.5ML ~~LOC~~ SOAJ: 2.5000 mg | SUBCUTANEOUS | 1 refills | Status: DC

## 2024-05-01 NOTE — Patient Instructions (Signed)
  Medication Instructions:  Your physician recommends that you continue on your current medications as directed. Please refer to the Current Medication list given to you today. --If you need a refill on any your medications before your next appointment, please call your pharmacy first. If no refills are authorized on file call the office.--   Referrals/Procedures/Imaging: Healthy Weight and Wellness  Follow-Up: Your next appointment:   Your physician recommends that you schedule a follow-up appointment in: 1 year physical  with Dr. de Peru  You will receive a text message or e-mail with a link to a survey about your care and experience with us  today! We would greatly appreciate your feedback!   Thanks for letting us  be apart of your health journey!!  Primary Care and Sports Medicine   Dr. Quintin sheerer Peru   We encourage you to activate your patient portal called MyChart.  Sign up information is provided on this After Visit Summary.  MyChart is used to connect with patients for Virtual Visits (Telemedicine).  Patients are able to view lab/test results, encounter notes, upcoming appointments, etc.  Non-urgent messages can be sent to your provider as well. To learn more about what you can do with MyChart, please visit --  [email or web address].

## 2024-05-01 NOTE — Progress Notes (Signed)
 Subjective:    CC: Annual Physical Exam  HPI:  Kaitlyn Mueller is a 48 y.o. presenting for annual physical  I reviewed the past medical history, family history, social history, surgical history, and allergies today and no changes were needed.  Please see the problem list section below in epic for further details.  Past Medical History: Past Medical History:  Diagnosis Date   Arthritis Not sure   Obesity    Seasonal allergies    Past Surgical History: History reviewed. No pertinent surgical history. Social History: Social History   Socioeconomic History   Marital status: Single    Spouse name: Not on file   Number of children: Not on file   Years of education: Not on file   Highest education level: 12th grade  Occupational History   Not on file  Tobacco Use   Smoking status: Never    Passive exposure: Never   Smokeless tobacco: Never   Tobacco comments:    Dont smoke  Vaping Use   Vaping status: Never Used  Substance and Sexual Activity   Alcohol use: No   Drug use: No   Sexual activity: Yes    Birth control/protection: Condom, None  Other Topics Concern   Not on file  Social History Narrative   Not on file   Social Drivers of Health   Financial Resource Strain: Low Risk  (04/28/2024)   Overall Financial Resource Strain (CARDIA)    Difficulty of Paying Living Expenses: Not hard at all  Food Insecurity: No Food Insecurity (04/28/2024)   Hunger Vital Sign    Worried About Running Out of Food in the Last Year: Never true    Ran Out of Food in the Last Year: Never true  Transportation Needs: No Transportation Needs (04/28/2024)   PRAPARE - Administrator, Civil Service (Medical): No    Lack of Transportation (Non-Medical): No  Physical Activity: Unknown (04/28/2024)   Exercise Vital Sign    Days of Exercise per Week: 2 days    Minutes of Exercise per Session: Patient declined  Stress: Patient Declined (04/28/2024)   [employer] of Occupational  Health - Occupational Stress Questionnaire    Feeling of Stress: Patient declined  Social Connections: Moderately Isolated (04/28/2024)   Social Connection and Isolation Panel    Frequency of Communication with Friends and Family: More than three times a week    Frequency of Social Gatherings with Friends and Family: Once a week    Attends Religious Services: 1 to 4 times per year    Active Member of [employer] or Organizations: No    Attends Engineer, structural: Not on file    Marital Status: Never married   Family History: Family History  Problem Relation Age of Onset   Hypertension Mother    Arthritis Mother    Breast cancer Neg Hx    BRCA 1/2 Neg Hx    Allergies: Allergies  Allergen Reactions   Other Rash    Peaches   Medications: See med rec.  Review of Systems: No headache, visual changes, nausea, vomiting, diarrhea, constipation, dizziness, abdominal pain, skin rash, fevers, chills, night sweats, swollen lymph nodes, weight loss, chest pain, body aches, joint swelling, muscle aches, shortness of breath, mood changes, visual or auditory hallucinations.  Objective:    BP 111/75 (BP Location: Right Arm, Patient Position: Sitting, Cuff Size: Large)   Pulse 71   Ht 5' 4 (1.626 m)   Wt (!) 370 lb 14.4 oz (  168.2 kg)   LMP  (LMP Unknown)   SpO2 100%   BMI 63.66 kg/m   General: Well Developed, well nourished, and in no acute distress. Neuro: Alert and oriented x3, extra-ocular muscles intact, sensation grossly intact. Cranial nerves II through XII are intact, motor, sensory, and coordinative functions are all intact. HEENT: Normocephalic, atraumatic, pupils equal round reactive to light, neck supple, no masses, no lymphadenopathy, thyroid nonpalpable. Oropharynx, nasopharynx, external ear canals are unremarkable. Skin: Warm and dry, no rashes noted. Cardiac: Regular rate and rhythm, no murmurs rubs or gallops. Respiratory: Clear to auscultation bilaterally. Not using  accessory muscles, speaking in full sentences. Abdominal: Soft, nontender, nondistended, positive bowel sounds, no masses, no organomegaly. Musculoskeletal: Shoulder, elbow, wrist, hip, knee, ankle stable, and with full range of motion.  Impression and Recommendations:    Wellness examination Assessment & Plan: Routine HCM labs reviewed. HCM reviewed/discussed. Anticipatory guidance regarding healthy weight, lifestyle and choices given. Recommend healthy diet.  Recommend approximately 150 minutes/week of moderate intensity exercise Recommend regular dental and vision exams Always use seatbelt/lap and shoulder restraints Recommend using smoke alarms and checking batteries at least twice a year Recommend using sunscreen when outside Discussed colon cancer screening recommendations, options.  Referral to gastroenterology for colonoscopy today Discussed immunization recommendations   Special screening for malignant neoplasms, colon -     Ambulatory referral to Gastroenterology  Cervical cancer screening -     Ambulatory referral to Obstetrics / Gynecology  Encounter for immunization -     Flu vaccine trivalent PF, 6mos and older(Flulaval,Afluria,Fluarix,Fluzone)  Severe obesity (BMI >= 40) (HCC) Assessment & Plan: We again discussed weight loss medications including injectable options including GLP-1 receptor agonist, oral agents including Contrave, orlistat, phentermine.  We discussed potential risk, benefits, cost associated with these various medications as well as the possibility of insurance coverage or lack thereof.  We discussed typical dosing regimen for both injectable and oral medications, proper administration of each.  We discussed potential outcomes with each. After discussion of potential risks and adverse reactions with these medications and potential benefits of each, patient would like to proceed with injectable GLP-1 receptor agonist if possible.  Prescription sent to  pharmacy on file, if medication is cost prohibitive for patient, she will let us  know and we can look to send an alternative option.  Will plan for close follow-up to monitor response to whichever medication patient is able to initiate - recommend 1 month follow-up if able to start injectable medication Additional consideration is for referral to healthy weight and wellness clinic - patient interested, referral placed today  Orders: -     Zepbound ; Inject 2.5 mg into the skin once a week.  Dispense: 2 mL; Refill: 1 -     Amb Ref to Medical Weight Management  Return in about 1 year (around 05/01/2025) for CPE.   ___________________________________________ Ameriah Lint de [place], MD, ABFM, CAQSM Primary Care and Sports Medicine [facility]

## 2024-05-01 NOTE — Assessment & Plan Note (Addendum)
 Routine HCM labs reviewed. HCM reviewed/discussed. Anticipatory guidance regarding healthy weight, lifestyle and choices given. Recommend healthy diet.  Recommend approximately 150 minutes/week of moderate intensity exercise Recommend regular dental and vision exams Always use seatbelt/lap and shoulder restraints Recommend using smoke alarms and checking batteries at least twice a year Recommend using sunscreen when outside Discussed colon cancer screening recommendations, options.  Referral to gastroenterology for colonoscopy today Discussed immunization recommendations

## 2024-05-01 NOTE — Assessment & Plan Note (Signed)
 We again discussed weight loss medications including injectable options including GLP-1 receptor agonist, oral agents including Contrave, orlistat, phentermine.  We discussed potential risk, benefits, cost associated with these various medications as well as the possibility of insurance coverage or lack thereof.  We discussed typical dosing regimen for both injectable and oral medications, proper administration of each.  We discussed potential outcomes with each. After discussion of potential risks and adverse reactions with these medications and potential benefits of each, patient would like to proceed with injectable GLP-1 receptor agonist if possible.  Prescription sent to pharmacy on file, if medication is cost prohibitive for patient, she will let us  know and we can look to send an alternative option.  Will plan for close follow-up to monitor response to whichever medication patient is able to initiate - recommend 1 month follow-up if able to start injectable medication Additional consideration is for referral to healthy weight and wellness clinic - patient interested, referral placed today

## 2024-05-07 ENCOUNTER — Encounter (HOSPITAL_BASED_OUTPATIENT_CLINIC_OR_DEPARTMENT_OTHER): Payer: Self-pay | Admitting: Family Medicine

## 2024-05-13 MED ORDER — SEMAGLUTIDE-WEIGHT MANAGEMENT 0.25 MG/0.5ML ~~LOC~~ SOAJ: 0.2500 mg | SUBCUTANEOUS | 1 refills | Status: DC

## 2024-05-14 ENCOUNTER — Other Ambulatory Visit: Payer: Self-pay | Admitting: Family Medicine

## 2024-05-14 DIAGNOSIS — Z1231 Encounter for screening mammogram for malignant neoplasm of breast: Secondary | ICD-10-CM

## 2024-05-15 ENCOUNTER — Encounter (INDEPENDENT_AMBULATORY_CARE_PROVIDER_SITE_OTHER): Payer: Self-pay

## 2024-05-15 ENCOUNTER — Encounter

## 2024-05-15 DIAGNOSIS — Z1231 Encounter for screening mammogram for malignant neoplasm of breast: Secondary | ICD-10-CM

## 2024-05-16 ENCOUNTER — Telehealth (HOSPITAL_BASED_OUTPATIENT_CLINIC_OR_DEPARTMENT_OTHER): Payer: Self-pay | Admitting: *Deleted

## 2024-05-16 NOTE — Telephone Encounter (Signed)
 Called insurance company to start a prior authorization on wegovy as it would not go through under cover my meds. Per patient RX benefits they do not cover any anti obesity drugs. LVM for patient to call the office regarding medication denial. Mychart msg sent as well.

## 2024-06-02 ENCOUNTER — Encounter

## 2024-06-02 DIAGNOSIS — Z1231 Encounter for screening mammogram for malignant neoplasm of breast: Secondary | ICD-10-CM

## 2024-06-11 ENCOUNTER — Ambulatory Visit
Admission: RE | Admit: 2024-06-11 | Discharge: 2024-06-11 | Disposition: A | Discharge status: Home / Self Care | Source: Ambulatory Visit | Attending: Family Medicine | Admitting: Family Medicine

## 2024-06-11 DIAGNOSIS — Z1231 Encounter for screening mammogram for malignant neoplasm of breast: Secondary | ICD-10-CM

## 2024-06-16 ENCOUNTER — Ambulatory Visit (HOSPITAL_BASED_OUTPATIENT_CLINIC_OR_DEPARTMENT_OTHER): Payer: Self-pay | Admitting: Family Medicine

## 2024-08-11 ENCOUNTER — Encounter: Payer: Self-pay | Admitting: Gastroenterology

## 2024-08-11 ENCOUNTER — Encounter (HOSPITAL_BASED_OUTPATIENT_CLINIC_OR_DEPARTMENT_OTHER): Payer: Self-pay | Admitting: Family Medicine

## 2024-08-14 ENCOUNTER — Encounter: Payer: Self-pay | Admitting: Obstetrics & Gynecology

## 2024-08-14 ENCOUNTER — Ambulatory Visit: Admitting: Obstetrics & Gynecology

## 2024-08-14 ENCOUNTER — Other Ambulatory Visit (HOSPITAL_COMMUNITY)
Admission: RE | Admit: 2024-08-14 | Discharge: 2024-08-14 | Disposition: A | Source: Ambulatory Visit | Attending: Family Medicine | Admitting: Family Medicine

## 2024-08-14 ENCOUNTER — Other Ambulatory Visit: Payer: Self-pay

## 2024-08-14 VITALS — BP 127/85 | HR 77 | Ht 64.0 in | Wt 370.0 lb

## 2024-08-14 DIAGNOSIS — Z113 Encounter for screening for infections with a predominantly sexual mode of transmission: Secondary | ICD-10-CM | POA: Diagnosis present

## 2024-08-14 DIAGNOSIS — Z7689 Persons encountering health services in other specified circumstances: Secondary | ICD-10-CM | POA: Diagnosis not present

## 2024-08-14 DIAGNOSIS — Z124 Encounter for screening for malignant neoplasm of cervix: Secondary | ICD-10-CM | POA: Insufficient documentation

## 2024-08-14 DIAGNOSIS — Z780 Asymptomatic menopausal state: Secondary | ICD-10-CM | POA: Insufficient documentation

## 2024-08-14 MED ORDER — ZEPBOUND 2.5 MG/0.5ML ~~LOC~~ SOAJ: 2.5000 mg | SUBCUTANEOUS | 1 refills | Status: AC

## 2024-08-14 NOTE — Progress Notes (Signed)
" ° °  GYNECOLOGY OFFICE VISIT NOTE  History:  Kaitlyn Mueller is a 49 y.o. G0P0000 here today to establish care.  She has been menopausal since June 2024, has rare hot flashes but no other vasomotor symptoms or other concerns.  She is sexually active, no issues, desires STI screen. She denies any abnormal vaginal discharge, bleeding, pelvic pain or other concerns.  Past Medical History:  Diagnosis Date   Arthritis Not sure   Obesity    Seasonal allergies     History reviewed. No pertinent surgical history.  The following portions of the patient's history were reviewed and updated as appropriate: allergies, current medications, past family history, past medical history, past social history, past surgical history and problem list.   Health Maintenance:  Normal pap at [facility] about 2-3 years ago.  Normal mammogram on 06/11/2024.  Scheduled for colonoscopy in February 2026.   Review of Systems:  Pertinent items noted in HPI and remainder of comprehensive ROS otherwise negative.  Physical Exam:  BP 127/85   Pulse 77   Ht 5' 4 (1.626 m)   Wt (!) 370 lb (167.8 kg)   LMP 02/03/2023 (Exact Date)   BMI 63.51 kg/m  CONSTITUTIONAL: Well-developed, well-nourished female in no acute distress.  HEENT:  Normocephalic, atraumatic. External right and left ear normal. No scleral icterus.  NECK: Normal range of motion, supple, no masses noted on observation SKIN: No rash noted. Not diaphoretic. No erythema. No pallor. MUSCULOSKELETAL: Normal range of motion. No edema noted. NEUROLOGIC: Alert and oriented to person, place, and time. Normal muscle tone coordination. No cranial nerve deficit noted on observation. PSYCHIATRIC: Normal mood and affect. Normal behavior. Normal judgment and thought content. CARDIOVASCULAR: Normal heart rate noted RESPIRATORY: Effort and breath sounds normal, no problems with respiration noted ABDOMEN: No masses or other overt distention noted on observation. No tenderness.    PELVIC: Normal appearing external genitalia; normal urethral meatus; normal appearing vaginal mucosa and cervix.  Pap smear done. No abnormal discharge noted.  No palpable masses, no uterine or adnexal tenderness; exam limited by habitus. Performed in the presence of a chaperone ([id number] [place], CMA)   Assessment and Plan:    1. Routine screening for STI (sexually transmitted infection) STI screen done, will follow up results and manage accordingly. - Cytology - PAP - RPR+HBsAg+HCVAb+HIV  2. Pap smear for cervical cancer screening (Primary) - Cytology - PAP done, will follow up results and manage accordingly.  3. Encounter to establish care Welcomed patient to our practice, nature of practice discussed.   All questions answered.  Routine preventative health maintenance measures emphasized. Please refer to After Visit Summary for other counseling recommendations.   Return for any gynecologic concerns.    I spent 45 minutes dedicated to the care of this patient including pre-visit review of records, face to face time with the patient discussing her conditions and treatments, post visit ordering of medications and appropriate tests or procedures, coordinating care and documenting this visit encounter.    GLORIS HUGGER, MD, FACOG Obstetrician & Gynecologist, [facility] for [employer], [facility] "

## 2024-08-15 ENCOUNTER — Ambulatory Visit: Payer: Self-pay | Admitting: Obstetrics & Gynecology

## 2024-08-15 ENCOUNTER — Telehealth (HOSPITAL_BASED_OUTPATIENT_CLINIC_OR_DEPARTMENT_OTHER): Payer: Self-pay | Admitting: Pharmacy Technician

## 2024-08-15 ENCOUNTER — Other Ambulatory Visit (HOSPITAL_COMMUNITY): Payer: Self-pay

## 2024-08-15 LAB — RPR+HBSAG+HCVAB+...
HIV Screen 4th Generation wRfx: NONREACTIVE
Hep C Virus Ab: NONREACTIVE
Hepatitis B Surface Ag: NEGATIVE
RPR Ser Ql: NONREACTIVE

## 2024-08-15 NOTE — Telephone Encounter (Signed)
 Pharmacy Patient Advocate Encounter   Received notification from Patient Advice Request messages that prior authorization for Zepbound  2.5MG /0.5ML pen-injectors is required/requested.   Insurance verification completed.   The patient is insured through [employer].   Per test claim: PA required; PA submitted to above mentioned insurance via Latent Key/confirmation #/EOC [facility] Status is pending

## 2024-08-15 NOTE — Telephone Encounter (Signed)
 PA request has been Started. New Encounter has been or will be created for follow up. For additional info see Pharmacy Prior Auth telephone encounter from 08/15/2024.

## 2024-08-18 NOTE — Telephone Encounter (Signed)
 Pharmacy Patient Advocate Encounter  Received notification from [employer] that Prior Authorization for Zepbound  2.5MG /0.5ML pen-injectors has been DENIED.  Full denial letter will be uploaded to the media tab. See denial reason below.   PA #/Case ID/Reference #: [id number]

## 2024-08-20 LAB — CYTOLOGY - PAP
Chlamydia: NEGATIVE
Comment: NEGATIVE
Comment: NEGATIVE
Comment: NEGATIVE
Comment: NORMAL
Diagnosis: NEGATIVE
High risk HPV: NEGATIVE
Neisseria Gonorrhea: NEGATIVE
Trichomonas: NEGATIVE

## 2024-08-21 ENCOUNTER — Telehealth: Payer: Self-pay

## 2024-08-21 NOTE — Telephone Encounter (Signed)
 During chart prep for upcoming PV/Colonoscopy with Dr. Charlanne, RN noted patient's BMI was 63.48.  Patient called and informed this puts her in a high risk category, procedure would not be able to performed in an outpatient setting and she would need an OV 1st to discuss risks, exam, etc.  Informed BMI's over 50 are done at the hospital.  Patient verbalized understanding. PV and [facility] colonoscopy canceled.  OV scheduled with D.May,NP for 09/16/24 @ 1430. SChaplin, RN,BSN

## 2024-08-22 MED ORDER — METFORMIN HCL 500 MG PO TABS: ORAL_TABLET | ORAL | 0 refills | Status: AC

## 2024-08-22 NOTE — Addendum Note (Signed)
 Addended by: DE CUBA, RJ J on: 08/22/2024 11:56 AM   Modules accepted: Orders

## 2024-09-04 ENCOUNTER — Encounter

## 2024-09-16 ENCOUNTER — Ambulatory Visit: Admitting: Gastroenterology

## 2024-09-19 ENCOUNTER — Encounter: Admitting: Gastroenterology

## 2025-05-04 ENCOUNTER — Encounter (HOSPITAL_BASED_OUTPATIENT_CLINIC_OR_DEPARTMENT_OTHER): Admitting: Family Medicine
